# Patient Record
Sex: Male | Born: 1970 | Race: White | Hispanic: No | Smoking: Former smoker
Health system: Southern US, Community
[De-identification: ages and names within clinical notes are randomized; demographics above are authoritative.]

## PROBLEM LIST (undated history)

## (undated) DIAGNOSIS — F419 Anxiety disorder, unspecified: Secondary | ICD-10-CM

## (undated) DIAGNOSIS — F329 Major depressive disorder, single episode, unspecified: Secondary | ICD-10-CM

## (undated) DIAGNOSIS — S4990XA Unspecified injury of shoulder and upper arm, unspecified arm, initial encounter: Secondary | ICD-10-CM

## (undated) DIAGNOSIS — F32A Depression, unspecified: Secondary | ICD-10-CM

## (undated) HISTORY — PX: TONSILLECTOMY: SUR1361

---

## 2006-12-22 ENCOUNTER — Emergency Department (HOSPITAL_COMMUNITY): Admission: EM | Admit: 2006-12-22 | Discharge: 2006-12-22 | Payer: Self-pay | Admitting: *Deleted

## 2007-03-08 ENCOUNTER — Emergency Department (HOSPITAL_COMMUNITY): Admission: EM | Admit: 2007-03-08 | Discharge: 2007-03-08 | Payer: Self-pay | Admitting: Emergency Medicine
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
## 2012-01-11 ENCOUNTER — Ambulatory Visit: Payer: Self-pay | Admitting: Family Medicine

## 2012-01-11 VITALS — BP 115/73 | HR 75 | Temp 98.4°F | Resp 16 | Ht 71.0 in | Wt 219.0 lb

## 2012-01-11 DIAGNOSIS — Z0289 Encounter for other administrative examinations: Secondary | ICD-10-CM

## 2012-01-11 DIAGNOSIS — Z0000 Encounter for general adult medical examination without abnormal findings: Secondary | ICD-10-CM

## 2012-01-11 LAB — POCT URINALYSIS DIPSTICK
Protein, UA: NEGATIVE
Spec Grav, UA: 1.01

## 2012-01-11 NOTE — Progress Notes (Signed)
@[facility] @  Patient ID: CAMDON SAETERN MRN: [id number], DOB: September 25, 1970 41 y.o. Date of Encounter: 01/11/2012, 12:49 PM  Primary Physician: No primary provider on file.  Chief Complaint: Physical (CPE)  HPI: 41 y.o. y/o male with history noted below here for CPE.  Doing well. No issues/complaints. In the last year patient's last 100 pounds and quit smoking.  Review of Systems: Consitutional: No fever, chills, fatigue, night sweats, lymphadenopathy, or weight changes. Eyes: No visual changes, eye redness, or discharge. ENT/Mouth: Ears: No otalgia, tinnitus, hearing loss, discharge. Nose: No congestion, rhinorrhea, sinus pain, or epistaxis. Throat: No sore throat, post nasal drip, or teeth pain. Cardiovascular: No CP, palpitations, diaphoresis, DOE, edema, orthopnea, PND. Respiratory: No cough, hemoptysis, SOB, or wheezing. Gastrointestinal: No anorexia, dysphagia, reflux, pain, nausea, vomiting, hematemesis, diarrhea, constipation, BRBPR, or melena. Genitourinary: No dysuria, frequency, urgency, hematuria, incontinence, nocturia, decreased urinary stream, discharge, impotence, or testicular pain/masses. Musculoskeletal: No decreased ROM, myalgias, stiffness, joint swelling, or weakness. Skin: No rash, erythema, lesion changes, pain, warmth, jaundice, or pruritis. Neurological: No headache, dizziness, syncope, seizures, tremors, memory loss, coordination problems, or paresthesias. Psychological: No anxiety, depression, hallucinations, SI/HI. Endocrine: No fatigue, polydipsia, polyphagia, polyuria, or known diabetes. All other systems were reviewed and are otherwise negative.  No past medical history on file.   No past surgical history on file.  Home Meds:  Prior to Admission medications   Not on File    Allergies:  Allergies  Allergen Reactions  . Arithmin (Antazoline)     arithromycin  . Tetracyclines & Related     History   Social History  . Marital Status: Divorced    Spouse Name: N/A    Number of Children: N/A  . Years of Education: N/A   Occupational History  . Not on file.   Social History Main Topics  . Smoking status: Never Smoker   . Smokeless tobacco: Not on file  . Alcohol Use: Not on file  . Drug Use: Not on file  . Sexually Active: Not on file   Other Topics Concern  . Not on file   Social History Narrative  . No narrative on file    No family history on file.  Physical Exam: Blood pressure 115/73, pulse 75, temperature 98.4 F (36.9 C), resp. rate 16, height 5\' 11"  (1.803 m), weight 219 lb (99.338 kg).  General: Well developed, well nourished, in no acute distress. HEENT: Normocephalic, atraumatic. Conjunctiva pink, sclera non-icteric. Pupils 2 mm constricting to 1 mm, round, regular, and equally reactive to light and accomodation. EOMI. Internal auditory canal clear. TMs with good cone of light and without pathology. Nasal mucosa pink. Nares are without discharge. No sinus tenderness. Oral mucosa pink. Dentition. Pharynx without exudate.   Neck: Supple. Trachea midline. No thyromegaly. Full ROM. No lymphadenopathy. Lungs: Clear to auscultation bilaterally without wheezes, rales, or rhonchi. Breathing is of normal effort and unlabored. Cardiovascular: RRR with S1 S2. No murmurs, rubs, or gallops appreciated. Distal pulses 2+ symmetrically. No carotid or abdominal bruits. Abdomen: Soft, non-tender, non-distended with normoactive bowel sounds. No hepatosplenomegaly or masses. No rebound/guarding. No CVA tenderness. Without hernias.   Genitourinary:  circumcised male. No penile lesions. Testes descended bilaterally, and smooth without tenderness or masses.  Musculoskeletal: Full range of motion and 5/5 strength throughout. Without swelling, atrophy, tenderness, crepitus, or warmth. Extremities without clubbing, cyanosis, or edema. Calves supple. Skin: Warm and moist without erythema, ecchymosis, wounds, or rash. Neuro: A+Ox3. CN II-XII  grossly intact. Moves all extremities spontaneously. Full  sensation throughout. Normal gait. DTR 2+ throughout upper and lower extremities. Finger to nose intact. Psych:  Responds to questions appropriately with a normal affect.   Studies:  UA: normal  Assessment/Plan:  41 y.o. y/o health male here for CPE -  Signed, Elvina Sidle, MD 01/11/2012 12:49 PM

## 2012-07-15 ENCOUNTER — Emergency Department: Payer: Self-pay | Admitting: Unknown Physician Specialty

## 2012-10-17 ENCOUNTER — Emergency Department: Payer: Self-pay | Admitting: Emergency Medicine

## 2013-05-25 DIAGNOSIS — Y929 Unspecified place or not applicable: Secondary | ICD-10-CM | POA: Insufficient documentation

## 2013-05-25 DIAGNOSIS — X500XXA Overexertion from strenuous movement or load, initial encounter: Secondary | ICD-10-CM | POA: Insufficient documentation

## 2013-05-25 DIAGNOSIS — IMO0002 Reserved for concepts with insufficient information to code with codable children: Secondary | ICD-10-CM | POA: Insufficient documentation

## 2013-05-25 DIAGNOSIS — Y9389 Activity, other specified: Secondary | ICD-10-CM | POA: Insufficient documentation

## 2013-05-25 DIAGNOSIS — F17200 Nicotine dependence, unspecified, uncomplicated: Secondary | ICD-10-CM | POA: Insufficient documentation

## 2013-05-26 ENCOUNTER — Emergency Department (HOSPITAL_COMMUNITY)
Admission: EM | Admit: 2013-05-26 | Discharge: 2013-05-26 | Disposition: A | Discharge status: Home / Self Care | Payer: Self-pay | Attending: Emergency Medicine | Admitting: Emergency Medicine

## 2013-05-26 ENCOUNTER — Encounter (HOSPITAL_COMMUNITY): Payer: Self-pay | Admitting: Emergency Medicine

## 2013-05-26 DIAGNOSIS — S8391XA Sprain of unspecified site of right knee, initial encounter: Secondary | ICD-10-CM

## 2013-05-26 MED ORDER — HYDROCODONE-ACETAMINOPHEN 5-325 MG PO TABS: 1.0000 | ORAL_TABLET | Freq: Four times a day (QID) | ORAL | Status: AC | PRN

## 2013-05-26 MED ORDER — DICLOFENAC SODIUM 75 MG PO TBEC: 75.0000 mg | DELAYED_RELEASE_TABLET | Freq: Two times a day (BID) | ORAL | Status: DC

## 2013-05-26 MED ORDER — HYDROCODONE-ACETAMINOPHEN 5-325 MG PO TABS: 1.0000 | ORAL_TABLET | ORAL | Status: DC | PRN

## 2013-05-26 MED ORDER — DICLOFENAC SODIUM 75 MG PO TBEC: 75.0000 mg | DELAYED_RELEASE_TABLET | Freq: Two times a day (BID) | ORAL | Status: AC

## 2013-05-26 MED ORDER — KETOROLAC TROMETHAMINE 10 MG PO TABS
10.0000 mg | ORAL_TABLET | Freq: Once | ORAL | Status: AC
  Administered 2013-05-26: 10 mg via ORAL
  Filled 2013-05-26: qty 1

## 2013-05-26 MED ORDER — HYDROCODONE-ACETAMINOPHEN 5-325 MG PO TABS
2.0000 | ORAL_TABLET | Freq: Once | ORAL | Status: AC
  Administered 2013-05-26: 2 via ORAL
  Filled 2013-05-26: qty 2

## 2013-05-26 NOTE — ED Provider Notes (Signed)
Medical screening examination/treatment/procedure(s) were performed by non-physician practitioner and as supervising physician I was immediately available for consultation/collaboration.  Ryun Velez, MD 05/26/13 0413 

## 2013-05-26 NOTE — ED Notes (Signed)
Pt states he twisted his right knee yesterday

## 2013-05-26 NOTE — ED Provider Notes (Signed)
CSN: [id number]     Arrival date & time 05/25/13  2353 History   First MD Initiated Contact with Patient 05/26/13 0007     Chief Complaint  Patient presents with  . Knee Injury   (Consider location/radiation/quality/duration/timing/severity/associated sxs/prior Treatment) HPI Comments: Patient presents to the emergency department with complaint of right knee pain. Patient states that on yesterday October 16 he stepped in hole and twisted his knee. Since that time he's been having pain when he applies pressure to the knee. Today he thought he noted some swelling, and tonight he states that the pain got progressively worse and would not respond to his current medications. Patient states he has not had any procedures or operations on the right knee. He denies any other injury or problem.  The history is provided by the patient and the spouse.    History reviewed. No pertinent past medical history. History reviewed. No pertinent past surgical history. No family history on file. History  Substance Use Topics  . Smoking status: Current Every Day Smoker  . Smokeless tobacco: Not on file  . Alcohol Use: No    Review of Systems  Constitutional: Negative for activity change.       All ROS Neg except as noted in HPI  HENT: Negative for nosebleeds.   Eyes: Negative for photophobia and discharge.  Respiratory: Negative for cough, shortness of breath and wheezing.   Cardiovascular: Negative for chest pain and palpitations.  Gastrointestinal: Negative for abdominal pain and blood in stool.  Genitourinary: Negative for dysuria, frequency and hematuria.  Musculoskeletal: Negative for arthralgias, back pain and neck pain.  Skin: Negative.   Neurological: Negative for dizziness, seizures and speech difficulty.  Psychiatric/Behavioral: Negative for hallucinations and confusion.    Allergies  Arithmin; Erythromycin; and Tetracyclines & related  Home Medications   Current Outpatient Rx  Name   Route  Sig  Dispense  Refill  . ALPRAZolam (XANAX) 1 MG tablet   Oral   Take 1 mg by mouth at bedtime as needed for sleep.         Marland Kitchen amitriptyline (ELAVIL) 150 MG tablet   Oral   Take 250 mg by mouth at bedtime.         Marland Kitchen HYDROcodone-acetaminophen (NORCO/VICODIN) 5-325 MG per tablet   Oral   Take 1 tablet by mouth every 6 (six) hours as needed for pain.         Marland Kitchen diclofenac (VOLTAREN) 75 MG EC tablet   Oral   Take 1 tablet (75 mg total) by mouth 2 (two) times daily.   12 tablet   0   . HYDROcodone-acetaminophen (NORCO) 5-325 MG per tablet   Oral   Take 1 tablet by mouth every 4 (four) hours as needed for pain.   20 tablet   0    BP 132/68  Pulse 97  Temp(Src) 98.9 F (37.2 C) (Oral)  Resp 16  Ht 6' (1.829 m)  Wt 224 lb (101.606 kg)  BMI 30.37 kg/m2  SpO2 97% Physical Exam  Nursing note and vitals reviewed. Constitutional: He is oriented to person, place, and time. He appears well-developed and well-nourished.  Non-toxic appearance.  HENT:  Head: Normocephalic.  Right Ear: Tympanic membrane and external ear normal.  Left Ear: Tympanic membrane and external ear normal.  Eyes: EOM and lids are normal. Pupils are equal, round, and reactive to light.  Neck: Normal range of motion. Neck supple. Carotid bruit is not present.  Cardiovascular: Normal rate, regular rhythm,  normal heart sounds, intact distal pulses and normal pulses.   Pulmonary/Chest: Breath sounds normal. No respiratory distress.  Abdominal: Soft. Bowel sounds are normal. There is no tenderness. There is no guarding.  Musculoskeletal: Normal range of motion.  There is full range of motion of the right hip. There is fair range of motion of the right knee. There is no effusion present. There is pain to palpation of the medial aspect of the right. There is no posterior mass appreciated. There's no deformity of the quadriceps area or the anterior tibial tuberosity. The Achilles tendon is intact. The dorsalis  pedis pulse is 2+ bilaterally.  Lymphadenopathy:       Head (right side): No submandibular adenopathy present.       Head (left side): No submandibular adenopathy present.    He has no cervical adenopathy.  Neurological: He is alert and oriented to person, place, and time. He has normal strength. No cranial nerve deficit or sensory deficit.  Skin: Skin is warm and dry.  Psychiatric: He has a normal mood and affect. His speech is normal.    ED Course  Procedures (including critical care time) Labs Review Labs Reviewed - No data to display Imaging Review No results found.  EKG Interpretation   None       MDM   1. Knee sprain and strain, right, initial encounter    **I have reviewed nursing notes, vital signs, and all appropriate lab and imaging results for this patient.* Patient stepped in a hole and twisted the right knee on yesterday. Patient states that the pain is getting progressively worse and is not responding to his medications at home.  Is no effusion present. There is pain with attempted range of motion. Is no deformity of the patella. Patient is fitted with a knee immobilizer and given an ice pack. Patient is also fitted with crutches. The patient is to see Dr. Hilda Lias for evaluation if not improving. Prescription for diclofenac one tablet 2 times daily, and Norco. given to the patient. Patient given a work note to return on Tuesday, October 21.   Kathie Dike, PA-C 05/26/13 [phone]

## 2014-07-15 IMAGING — CR DG SHOULDER 3+V*R*
1 series · 3 of 3 positions shown · non-contrast
Comparison: none

REASON FOR EXAM: trauma, pain
COMMENTS:   May transport without cardiac monitor

PROCEDURE:     DXR - DXR SHOULDER RIGHT COMPLETE  - October 17, 2012 [DATE]
RESULT:     There is no evidence of fracture, dislocation, or malalignment.

[Series 1: internal rotate · 0.17mm/px · 3 of 3 slices shown]
[im 1/3]
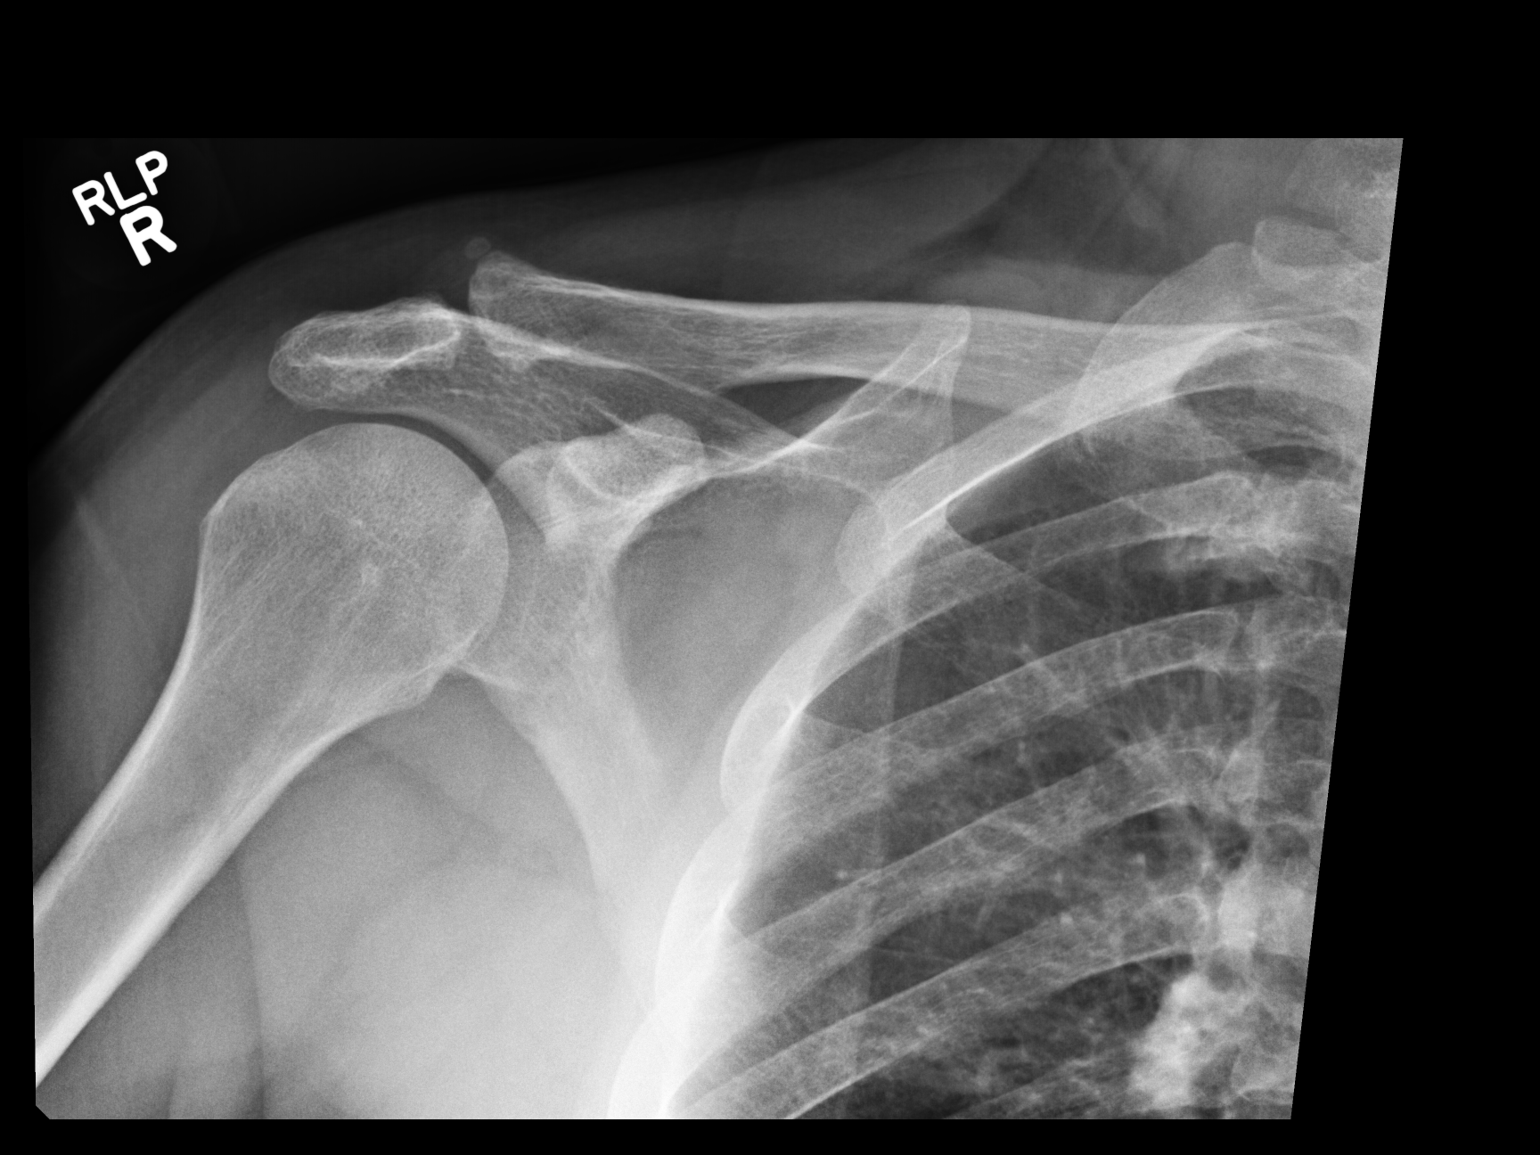
[im 2/3]
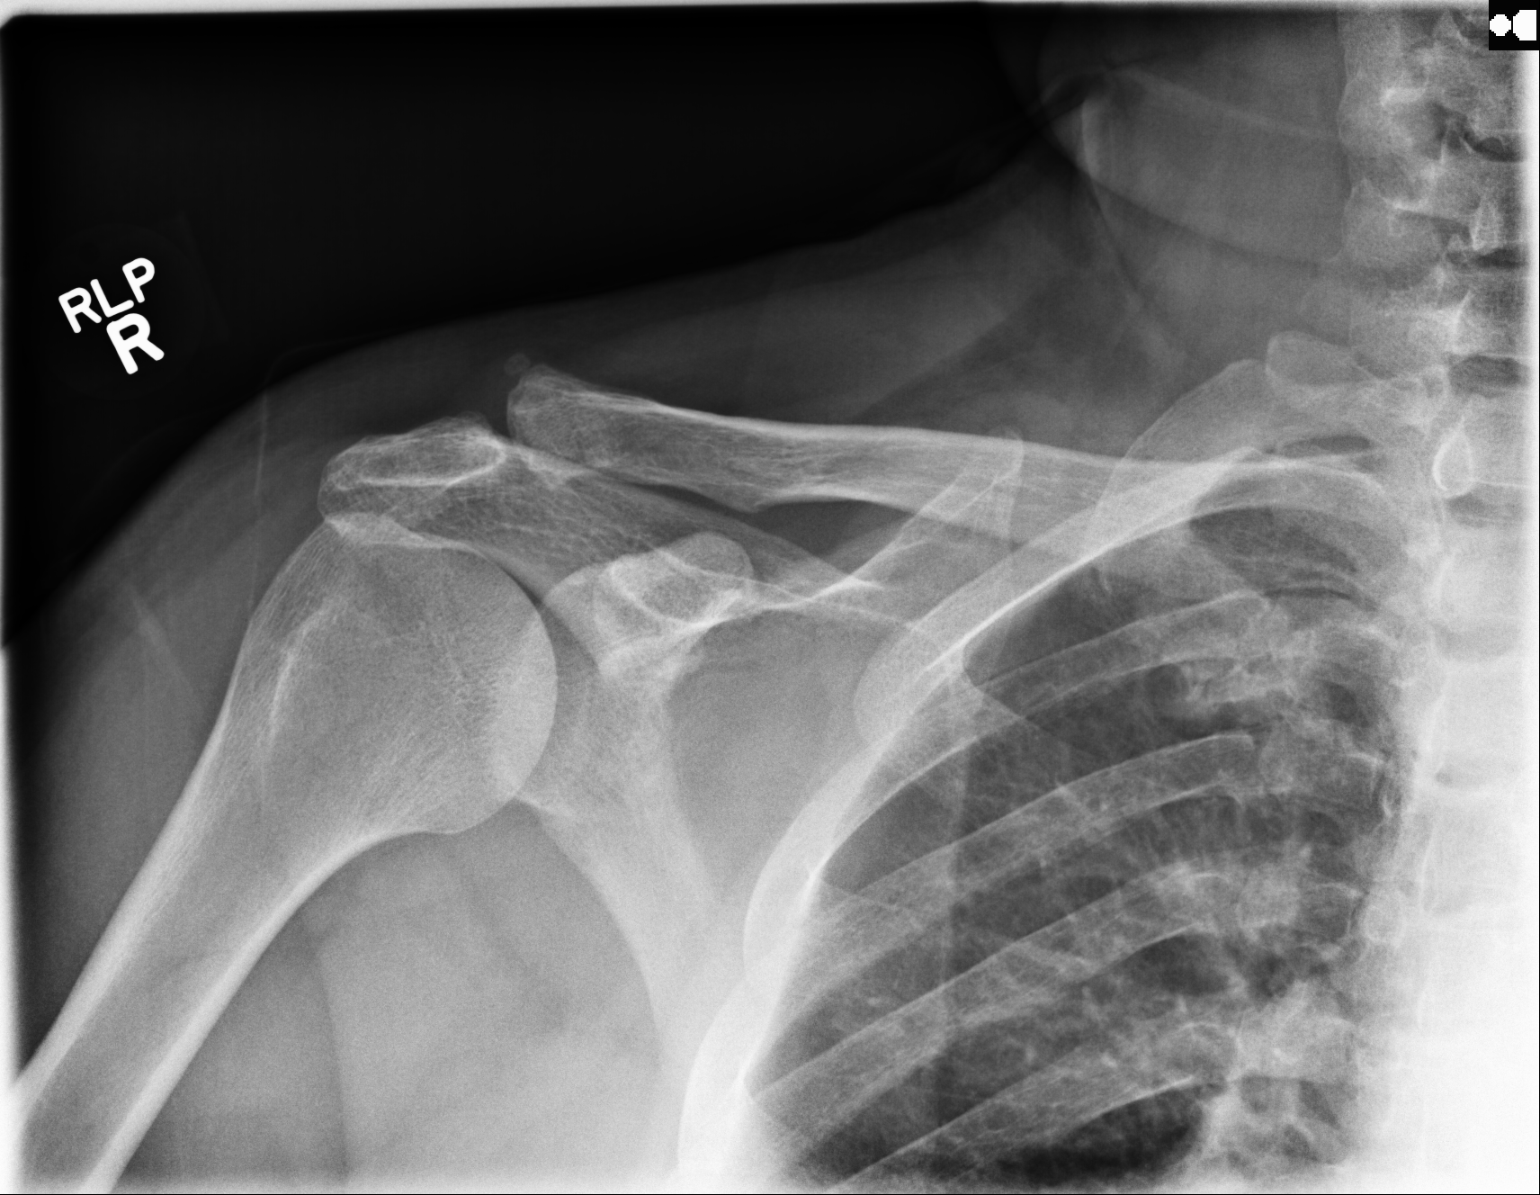
[im 3/3]
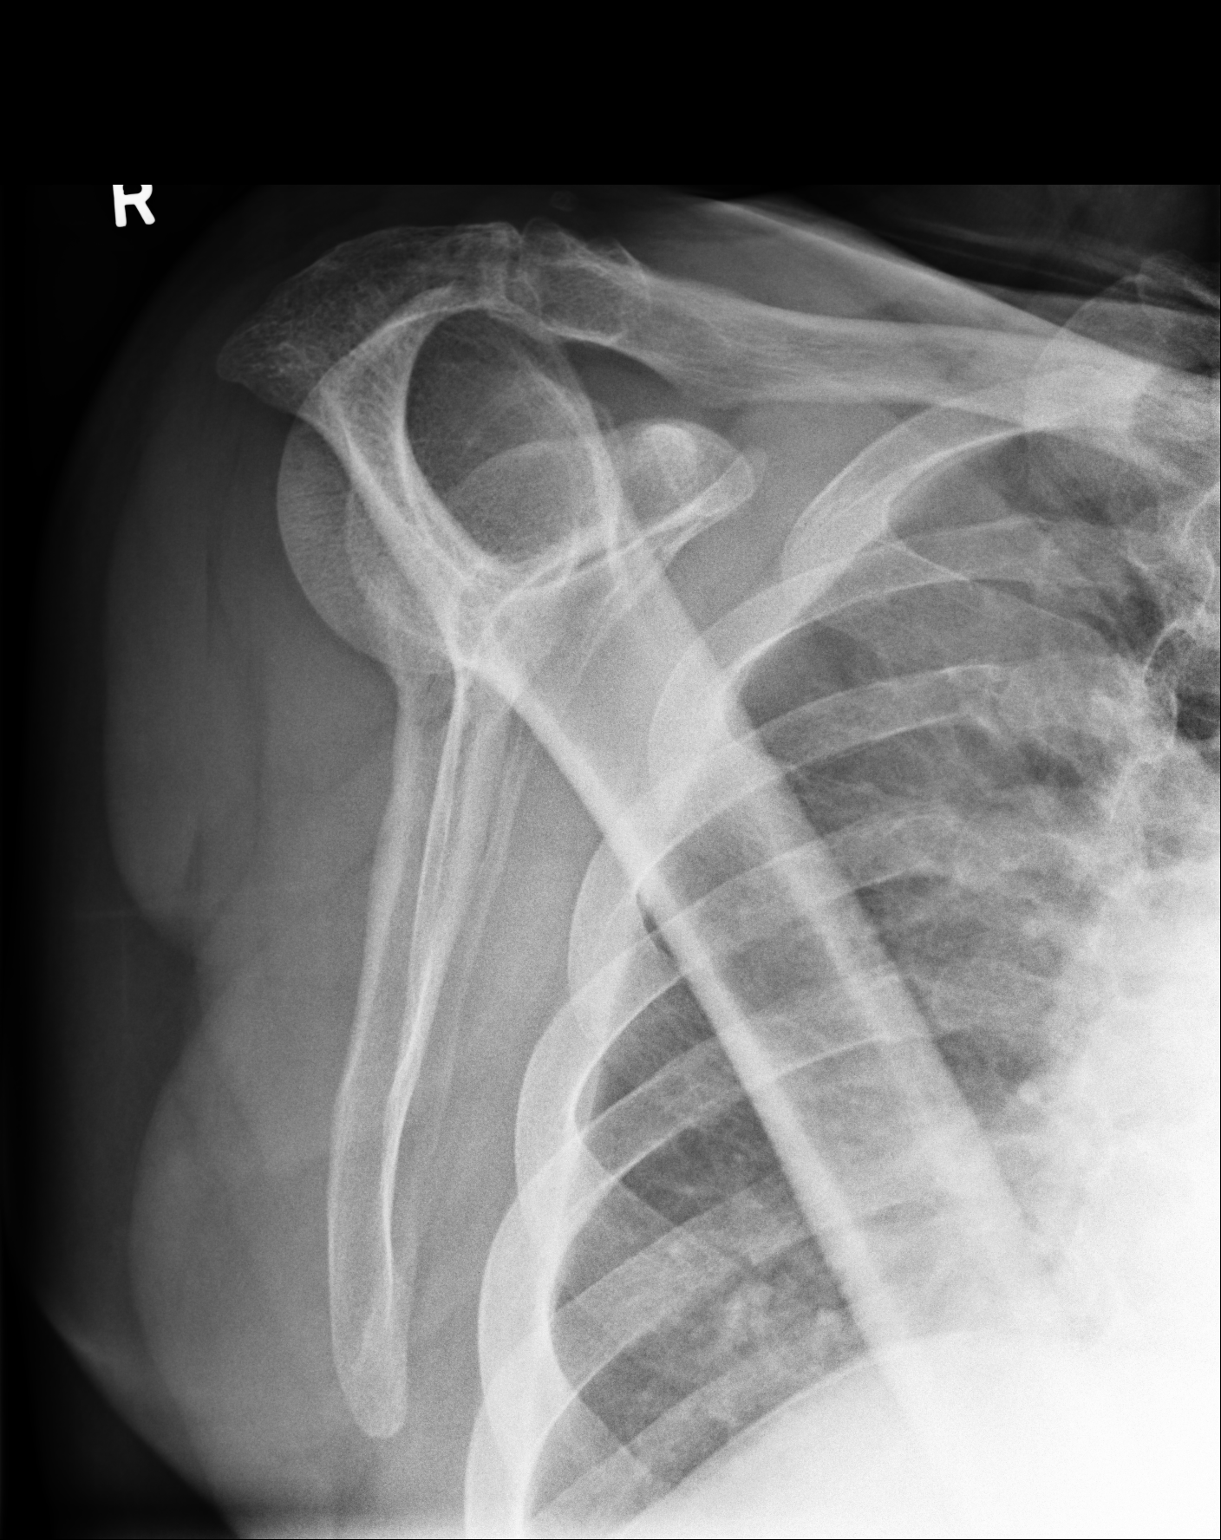

[3 of 3 positions shown; findings below may reference images not displayed]

IMPRESSION: 1. No evidence of acute abnormalities.
2. If there are persistent complaints of pain or persistent clinical
concern, a repeat evaluation in 7-10 days is recommended if clinically
warranted.

## 2014-08-24 ENCOUNTER — Encounter (HOSPITAL_COMMUNITY): Payer: Self-pay | Admitting: *Deleted

## 2014-08-24 ENCOUNTER — Emergency Department (HOSPITAL_COMMUNITY)
Admission: EM | Admit: 2014-08-24 | Discharge: 2014-08-25 | Disposition: A | Discharge status: Home / Self Care | Payer: Self-pay | Attending: Emergency Medicine | Admitting: Emergency Medicine

## 2014-08-24 DIAGNOSIS — F329 Major depressive disorder, single episode, unspecified: Secondary | ICD-10-CM | POA: Insufficient documentation

## 2014-08-24 DIAGNOSIS — R000 Tachycardia, unspecified: Secondary | ICD-10-CM | POA: Insufficient documentation

## 2014-08-24 DIAGNOSIS — Z87891 Personal history of nicotine dependence: Secondary | ICD-10-CM | POA: Insufficient documentation

## 2014-08-24 DIAGNOSIS — F419 Anxiety disorder, unspecified: Secondary | ICD-10-CM | POA: Insufficient documentation

## 2014-08-24 DIAGNOSIS — Z87828 Personal history of other (healed) physical injury and trauma: Secondary | ICD-10-CM | POA: Insufficient documentation

## 2014-08-24 DIAGNOSIS — Z791 Long term (current) use of non-steroidal anti-inflammatories (NSAID): Secondary | ICD-10-CM | POA: Insufficient documentation

## 2014-08-24 HISTORY — DX: Depression, unspecified: F32.A

## 2014-08-24 HISTORY — DX: Anxiety disorder, unspecified: F41.9

## 2014-08-24 HISTORY — DX: Unspecified injury of shoulder and upper arm, unspecified arm, initial encounter: S49.90XA

## 2014-08-24 HISTORY — DX: Major depressive disorder, single episode, unspecified: F32.9

## 2014-08-24 LAB — CBC
HEMATOCRIT: 45.2 % (ref 39.0–52.0)
HEMOGLOBIN: 15.7 g/dL (ref 13.0–17.0)
MCH: 29.8 pg (ref 26.0–34.0)
MCHC: 34.7 g/dL (ref 30.0–36.0)
MCV: 85.9 fL (ref 78.0–100.0)
PLATELETS: 218 10*3/uL (ref 150–400)
RBC: 5.26 MIL/uL (ref 4.22–5.81)
RDW: 12.8 % (ref 11.5–15.5)
WBC: 8.5 10*3/uL (ref 4.0–10.5)

## 2014-08-24 LAB — ACETAMINOPHEN LEVEL: Acetaminophen (Tylenol), Serum: <10 ug/mL — ABNORMAL LOW (ref 10–30)

## 2014-08-24 LAB — COMPREHENSIVE METABOLIC PANEL
ALBUMIN: 5.3 g/dL — AB (ref 3.5–5.2)
ALK PHOS: 122 U/L — AB (ref 39–117)
ALT: 45 U/L (ref 0–53)
ANION GAP: 8 (ref 5–15)
AST: 38 U/L — ABNORMAL HIGH (ref 0–37)
BUN: 17 mg/dL (ref 6–23)
CHLORIDE: 102 meq/L (ref 96–112)
CO2: 28 mmol/L (ref 19–32)
Calcium: 9.5 mg/dL (ref 8.4–10.5)
Creatinine, Ser: 0.91 mg/dL (ref 0.50–1.35)
GFR calc non Af Amer: >90 mL/min (ref >90)
GLUCOSE: 117 mg/dL — AB (ref 70–99)
POTASSIUM: 4.5 mmol/L (ref 3.5–5.1)
SODIUM: 138 mmol/L (ref 135–145)
Total Bilirubin: 0.6 mg/dL (ref 0.3–1.2)
Total Protein: 8.7 g/dL — ABNORMAL HIGH (ref 6.0–8.3)

## 2014-08-24 LAB — CBG MONITORING, ED: Glucose-Capillary: 121 mg/dL — ABNORMAL HIGH (ref 70–99)

## 2014-08-24 LAB — ETHANOL

## 2014-08-24 LAB — SALICYLATE LEVEL

## 2014-08-24 MED ORDER — SODIUM CHLORIDE 0.9 % IV BOLUS (SEPSIS)
1000.0000 mL | Freq: Once | INTRAVENOUS | Status: AC
  Administered 2014-08-24: 1000 mL via INTRAVENOUS

## 2014-08-24 MED ORDER — SODIUM CHLORIDE 0.9 % IV BOLUS (SEPSIS)
1000.0000 mL | Freq: Once | INTRAVENOUS | Status: AC
  Administered 2014-08-25: 1000 mL via INTRAVENOUS

## 2014-08-24 NOTE — ED Notes (Signed)
Pt. Placed on 2 liters oxygen per RN, Felipa FurnaceGarcia.

## 2014-08-24 NOTE — ED Notes (Signed)
Pt with suspious behavior. Urinal given to pt earlier with request for pt to provide urine specimen. Upon entering room pt found with clear liquid in urinal and states "I can't go but I'm trying." This RN asked pt to please pour liquid out of urinal and then remained with pt who states they are "unable to pee" at this time. MD made aware and states that the urine specimen is not a priority at this time.

## 2014-08-24 NOTE — ED Notes (Addendum)
Spoke with poison control, states to monitor patient for 4-6 hours, administer IV fluids and treat symptoms as they arise

## 2014-08-24 NOTE — ED Notes (Signed)
EKG given to EDP, Harrison,MD., for review. 

## 2014-08-24 NOTE — ED Notes (Signed)
[facility] on [address].  Patient states after coming back from his primary at 1830 this evening, he was asked to take a drug test, patient states he was unable to void, and he stated "the next thing I know, the ambulance was there".  EMS states the half-way house counted his meds and states that Xanax 1 mg x 20 were missing from the bottle.  EMS stated 6 Tramadol also missing from his bottle and the Rx's were just filled.  Patient denies taking the tramadol and xanax.  Patient is alert and oriented

## 2014-08-24 NOTE — ED Notes (Signed)
Pt. On cardiac monitor. 

## 2014-08-25 NOTE — ED Notes (Signed)
Spoke with rep from half way house. They will not provide transportation and so the pt will be responsible for making arrangements to return there

## 2014-08-25 NOTE — Discharge Instructions (Signed)
Panic Attacks °Panic attacks are sudden, short feelings of great fear or discomfort. You may have them for no reason when you are relaxed, when you are uneasy (anxious), or when you are sleeping.  °HOME CARE °· Take all your medicines as told. °· Check with your doctor before starting new medicines. °· Keep all doctor visits. °GET HELP IF: °· You are not able to take your medicines as told. °· Your symptoms do not get better. °· Your symptoms get worse. °GET HELP RIGHT AWAY IF: °· Your attacks seem different than your normal attacks. °· You have thoughts about hurting yourself or others. °· You take panic attack medicine and you have a side effect. °MAKE SURE YOU: °· Understand these instructions. °· Will watch your condition. °· Will get help right away if you are not doing well or get worse. °Document Released: 08/29/2010 Document Revised: 05/17/2013 Document Reviewed: 03/10/2013 °[employer]® Patient Information ©2015 [employer]. This information is not intended to replace advice given to you by your health care provider. Make sure you discuss any questions you have with your health care provider. ° °
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
## 2014-08-25 NOTE — ED Notes (Signed)
Pt has been given a phone number and access to a phone to do hourly check ins with the half way house he was at. He will be responsible for contacting them

## 2014-08-25 NOTE — ED Notes (Signed)
Pt presented this RN with clear plastic baggie containing medications that pt had been keeping in his wallet. He states that it was the "missing xanax and ultram" that he had placed in his wallet stating he was in the middle of putting them in his weekly pill bottle/tray when he must have been distracted. Xanax count is 104 and Ultram count is 59. 2nd RN, Barbara, at bedside to confirm med count. That leaves 16 xanax and 1 ultram unaccounted for and pt states he took 1 ultram and the xanax are in his pill tray.

## 2014-08-25 NOTE — ED Notes (Signed)
While cleaning pt's room, discharge paperwork was found in room..the patient left it behind when he left

## 2014-09-10 DEATH — deceased
